# Patient Record
Sex: Female | Born: 1950 | Hispanic: No | Marital: Married
Health system: Southern US, Community
[De-identification: ages and names within clinical notes are randomized; demographics above are authoritative.]

---

## 2010-03-23 ENCOUNTER — Ambulatory Visit: Payer: Self-pay | Admitting: Family Medicine

## 2010-03-23 DIAGNOSIS — E039 Hypothyroidism, unspecified: Secondary | ICD-10-CM | POA: Insufficient documentation

## 2010-03-23 DIAGNOSIS — N390 Urinary tract infection, site not specified: Secondary | ICD-10-CM

## 2010-03-24 ENCOUNTER — Encounter: Payer: Self-pay | Admitting: Family Medicine

## 2011-01-05 NOTE — Assessment & Plan Note (Signed)
Summary: NOV UTI/ hypothyroid   Vital Signs:  Patient profile:   60 year old female Menstrual status:  hysterectomy Height:      66 inches Weight:      154 pounds BMI:     24.95 O2 Sat:      98 % on Room air Temp:     98.7 degrees F oral Pulse rate:   105 / minute BP sitting:   132 / 90  (left arm) Cuff size:   large  Vitals Entered By: Payton Spark CMA (March 23, 2010 1:22 PM)  O2 Flow:  Room air CC: New to est. ? Reaction to Macrobid over the weekend. C/O HA and muscle aches.      Menstrual Status hysterectomy   Primary Care Provider:  Seymour Bars DO  CC:  New to est. ? Reaction to Macrobid over the weekend. C/O HA and muscle aches. .  History of Present Illness: 60 yo WF presents for NOV.  She has a hx of goiter and thyroid nodules.  She was on Synthroid then on natural dessicated thyroid 30 g 2 x a day.  Her last TSH was a year ago.  Her energy level has been good.  She has not needed a biopsy, surgery or radiation.  She has had a few thyroid u/s's done that showed multnodular goiter.    She reports 10 days of dysuria followed by flank pain and joint aches.  She started Macrobid last Friday.  She is running a fever on and off.  She feels worse on the medicine.  She has heavy breathing.      Current Medications (verified): 1)  Compounded Thyroid 60 Grains 2)  Vivelle-Dot 0.05 Mg/24hr Pttw (Estradiol) .... Place Patch 2 Times Weekly.  Allergies (verified): 1)  ! Macrobid 2)  ! Ceclor  Past History:  Past Medical History: multinodular goiter G2P2  gyn [facility] hx of abnormal pap  Past Surgical History: hysterectomy for noncancerous reasons with oophorectomy. Cervix was left intact  Family History: mother died from complications after a TKR, age 6.  DM. 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 Married to [place]. 2 grown children.- Daughter in [place].  Son grown in [place], homeless - had a brain injury. Smoker.  1/2 ppd x 30 yrs. Walks 30 min 5 x a wk. Denies ETOH.  Review of Systems       + fevers/sweats/weakness, unexplained wt loss/gain, no change in vision, no difficulty hearing, ringing in ears, no hay fever/allergies, + CP/discomfort, no palpitations, no breast lump/nipple discharge, + cough/wheeze, no blood in stool, no N/V/D, no nocturia, no leaking urine, no unusual vag bleeding, no vaginal/penile discharge, no muscle/joint pain, no rash, no new/changing mole, + HA, no memory loss, no anxiety, no sleep problem, no depression, no unexplained lumps, no easy bruising/bleeding, no concern with sexual function    Physical Exam  General:  alert, well-developed, well-nourished, and well-hydrated.   Head:  normocephalic and atraumatic.   Eyes:  sclera non icteric Nose:  no nasal discharge.   Mouth:  pharynx pink and moist and fair dentition.   Neck:  no masses.   Lungs:  Normal respiratory effort, chest expands symmetrically. Lungs are clear to auscultation, no crackles or wheezes. Heart:  Normal rate and regular rhythm. S1 and S2 normal without gallop, murmur, click, rub or other extra sounds. Abdomen:  soft, non-tender, normal bowel  sounds, no distention, no masses, no guarding, no hepatomegaly, and no splenomegaly.  no CVAT Extremities:  no LE edema Skin:  color normal and no rashes.   Cervical Nodes:  No lymphadenopathy noted Psych:  good eye contact, not anxious appearing, and not depressed appearing.     Impression & Recommendations:  Problem # 1:  UTI (ICD-599.0) UA inconclusive (but also partially treated with Macrobid).  Not tolerating Macrobid, so D/C'd.  Change to Bactrim DS and f/u urine cx.   Her updated medication list for this problem includes:    Bactrim Ds 800-160 Mg Tabs (Sulfamethoxazole-trimethoprim) .Marland Kitchen... 1 tab by mouth two times a day x 3 days  Orders: T-Culture, Urine ([id number]) T-CBC w/Diff  ([id number])  Problem # 2:  UNSPECIFIED HYPOTHYROIDISM (ICD-244.9) Update thyroid labs.  Plan to fill new RX at [facility].   Orders: T-T4, Free [phone]) T-TSH [phone]) T-T3, Free [phone])  Complete Medication List: 1)  Thyroid Grain 1/2 Grain  .Marland Kitchen.. 1 tab by mouth two times a day 2)  Vivelle-dot 0.05 Mg/24hr Pttw (Estradiol) .... Place patch 2 times weekly. 3)  Bactrim Ds 800-160 Mg Tabs (Sulfamethoxazole-trimethoprim) .Marland Kitchen.. 1 tab by mouth two times a day x 3 days  Other Orders: T-Comprehensive Metabolic Panel ([phone]) T-Lipid Profile ([id number])  Patient Instructions: 1)  Change Macrobid to Bactrim DS x 3 days for UTI. 2)  Will call you w/ urine cx results on Wed. 3)  Have fasting labs drawn. 4)  Will call you w/ results and plan to change thyroid meds accordingly.   5)  Return for CPE w/o pap smear in 4 mos. Prescriptions: BACTRIM DS 800-160 MG TABS (SULFAMETHOXAZOLE-TRIMETHOPRIM) 1 tab by mouth two times a day x 3 days  #6 tabs x 0   Entered and Authorized by:   Seymour Bars DO   Signed by:   Seymour Bars DO on 03/23/2010   Method used:   Electronically to        [facility]* (retail)       [address] [place], [place]  [address]       Ph: [phone]       Fax: [phone]   RxID:   [id number]   Appended Document: NOV UTI/ hypothyroid     Allergies: 1)  ! Macrobid 2)  ! Ceclor   Complete Medication List: 1)  Thyroid Grain 1/2 Grain  .Marland Kitchen.. 1 tab by mouth two times a day 2)  Vivelle-dot 0.05 Mg/24hr Pttw (Estradiol) .... Place patch 2 times weekly. 3)  Bactrim Ds 800-160 Mg Tabs (Sulfamethoxazole-trimethoprim) .Marland Kitchen.. 1 tab by mouth two times a day x 3 days  Other Orders: UA Dipstick w/o Micro (automated)  ([id number])   Laboratory Results   Urine Tests    Routine Urinalysis   Color: yellow Appearance: Hazy Glucose: negative   (Normal Range: Negative) Bilirubin: small   (Normal Range: Negative) Ketone: trace (5)    (Normal Range: Negative) Spec. Gravity: 1.020   (Normal Range: 1.003-1.035) Blood: negative   (Normal Range: Negative) pH: 6.0   (Normal Range: 5.0-8.0) Protein: 30   (Normal Range: Negative) Urobilinogen: 0.2   (Normal Range: 0-1) Nitrite: negative   (Normal Range: Negative) Leukocyte Esterace: negative   (Normal Range: Negative)

## 2014-10-01 ENCOUNTER — Other Ambulatory Visit: Payer: Self-pay | Admitting: Obstetrics & Gynecology

## 2014-10-01 DIAGNOSIS — N63 Unspecified lump in unspecified breast: Secondary | ICD-10-CM

## 2014-10-11 ENCOUNTER — Other Ambulatory Visit: Payer: Self-pay | Admitting: Obstetrics & Gynecology

## 2014-10-11 ENCOUNTER — Ambulatory Visit
Admission: RE | Admit: 2014-10-11 | Discharge: 2014-10-11 | Disposition: A | Discharge status: Home / Self Care | Payer: Commercial Indemnity | Source: Ambulatory Visit | Attending: Obstetrics & Gynecology | Admitting: Obstetrics & Gynecology

## 2014-10-11 ENCOUNTER — Encounter (INDEPENDENT_AMBULATORY_CARE_PROVIDER_SITE_OTHER): Payer: Self-pay

## 2014-10-11 DIAGNOSIS — N63 Unspecified lump in unspecified breast: Secondary | ICD-10-CM

## 2014-10-11 DIAGNOSIS — N6320 Unspecified lump in the left breast, unspecified quadrant: Secondary | ICD-10-CM

## 2014-10-14 ENCOUNTER — Ambulatory Visit
Admission: RE | Admit: 2014-10-14 | Discharge: 2014-10-14 | Disposition: A | Discharge status: Home / Self Care | Payer: Commercial Indemnity | Source: Ambulatory Visit | Attending: Obstetrics & Gynecology | Admitting: Obstetrics & Gynecology

## 2014-10-14 ENCOUNTER — Other Ambulatory Visit: Payer: Self-pay | Admitting: Obstetrics & Gynecology

## 2014-10-14 DIAGNOSIS — N63 Unspecified lump in unspecified breast: Secondary | ICD-10-CM

## 2014-10-14 DIAGNOSIS — C50912 Malignant neoplasm of unspecified site of left female breast: Secondary | ICD-10-CM

## 2014-10-17 ENCOUNTER — Ambulatory Visit
Admission: RE | Admit: 2014-10-17 | Discharge: 2014-10-17 | Disposition: A | Discharge status: Home / Self Care | Payer: Commercial Indemnity | Source: Ambulatory Visit | Attending: Obstetrics & Gynecology | Admitting: Obstetrics & Gynecology

## 2014-10-17 DIAGNOSIS — C50912 Malignant neoplasm of unspecified site of left female breast: Secondary | ICD-10-CM

## 2014-10-17 MED ORDER — GADOBENATE DIMEGLUMINE 529 MG/ML IV SOLN
16.0000 mL | Freq: Once | INTRAVENOUS | Status: AC | PRN
  Administered 2014-10-17: 16 mL via INTRAVENOUS

## 2015-08-15 IMAGING — MG MM DIGITAL DIAGNOSTIC BILAT CAD
5 series · 5 of 5 positions shown · non-contrast
Comparison: None.

CLINICAL DATA: Palpable lump left breast

EXAM:
DIGITAL DIAGNOSTIC  BILATERAL MAMMOGRAM WITH CAD
ULTRASOUND LEFT BREAST

[R CC]
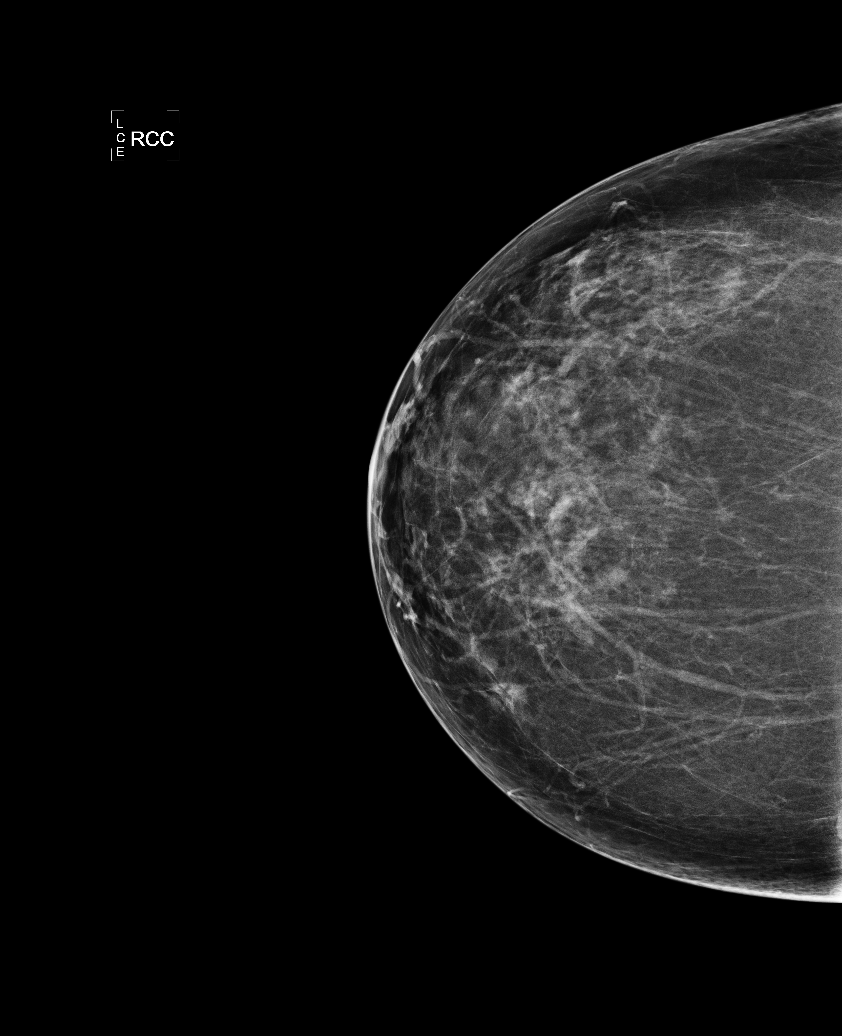

[L CC]
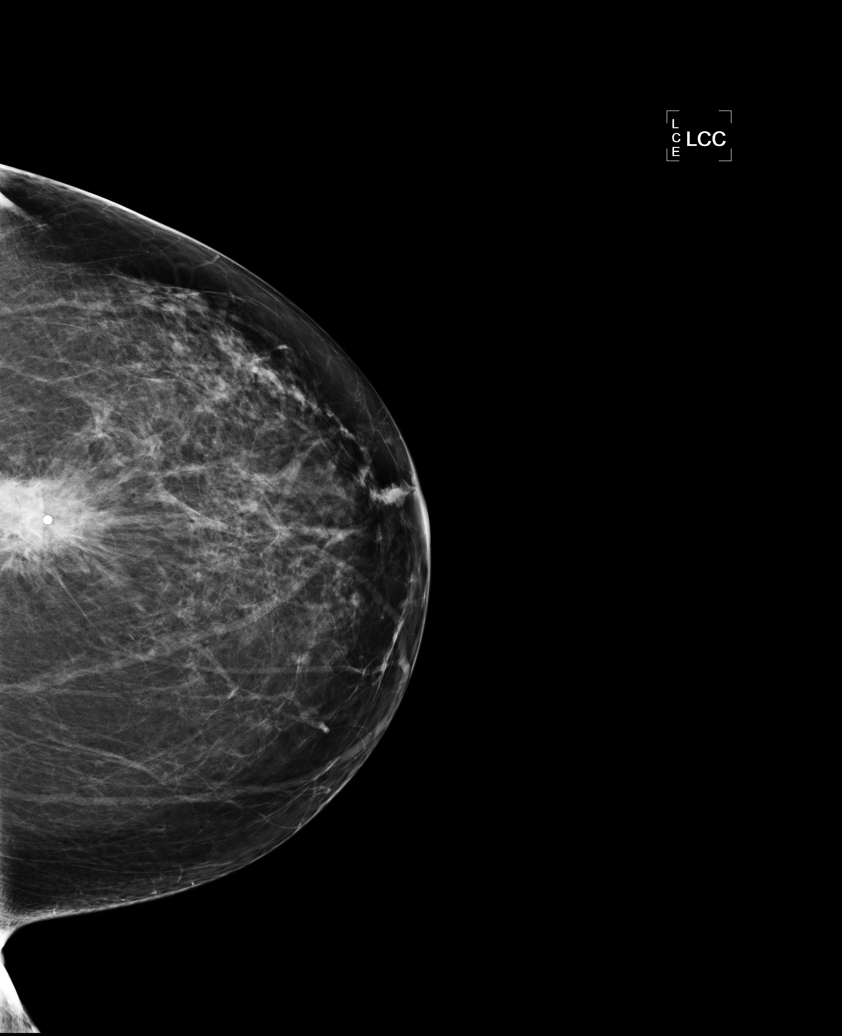

[L MLO]
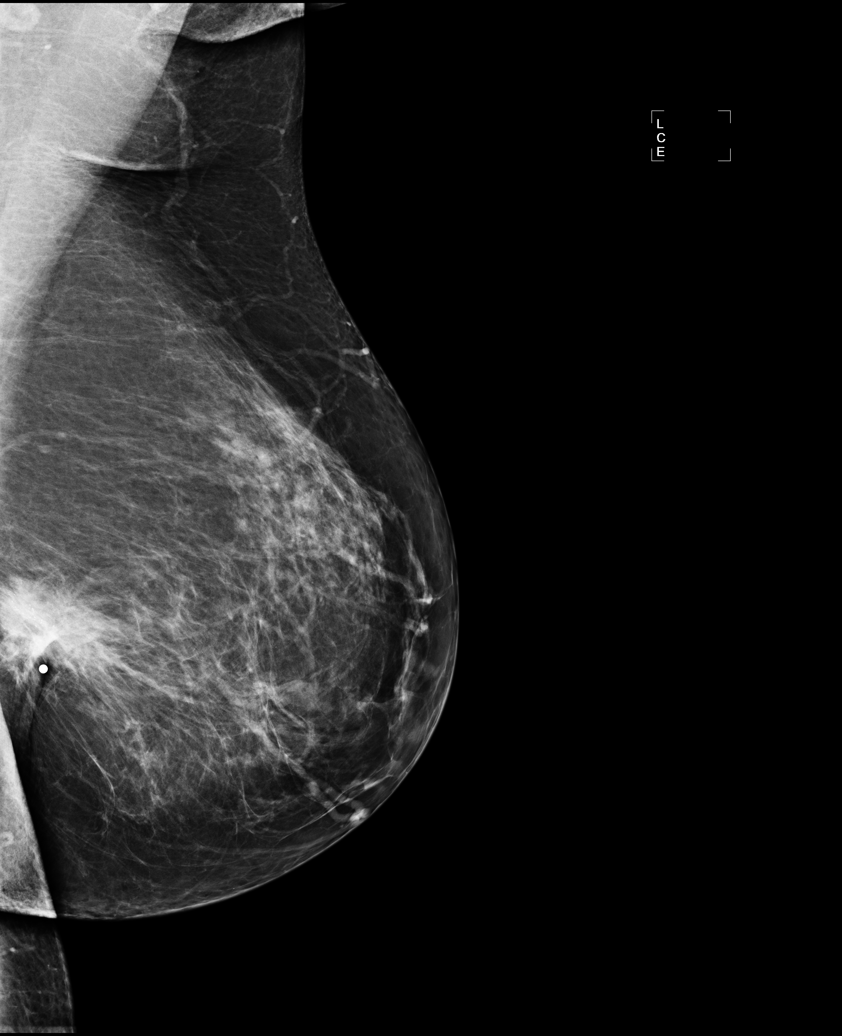

[R MLO]
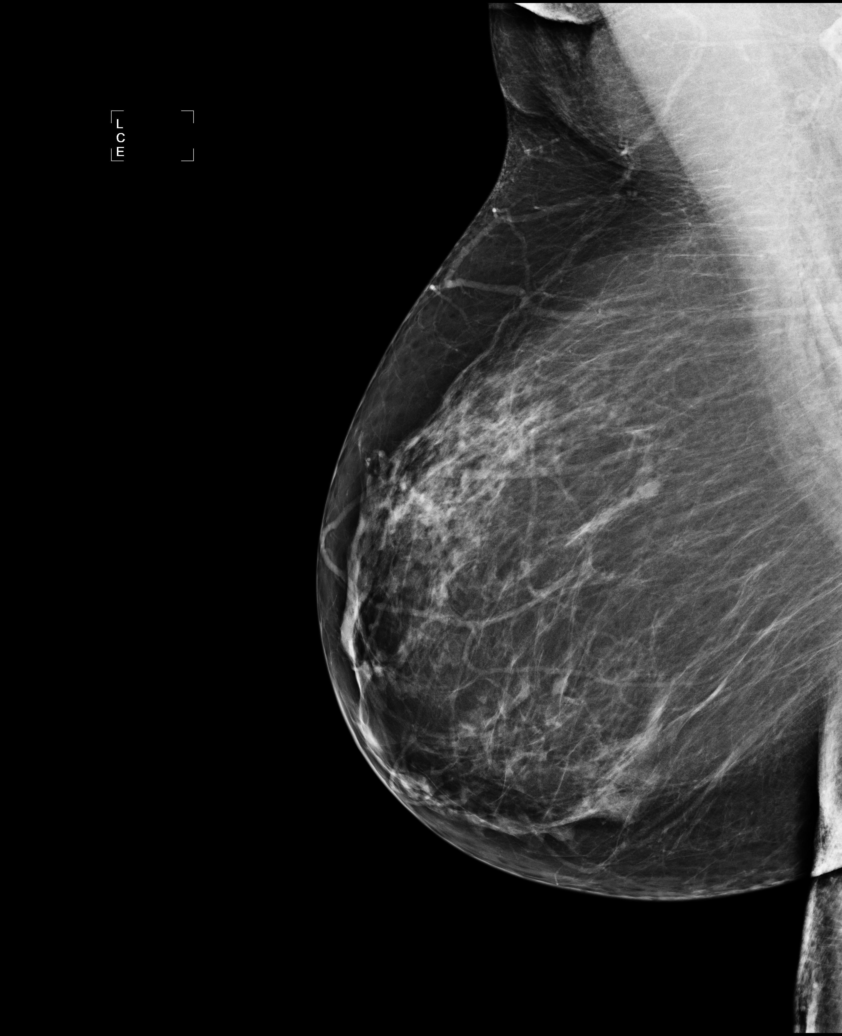

[L LM]
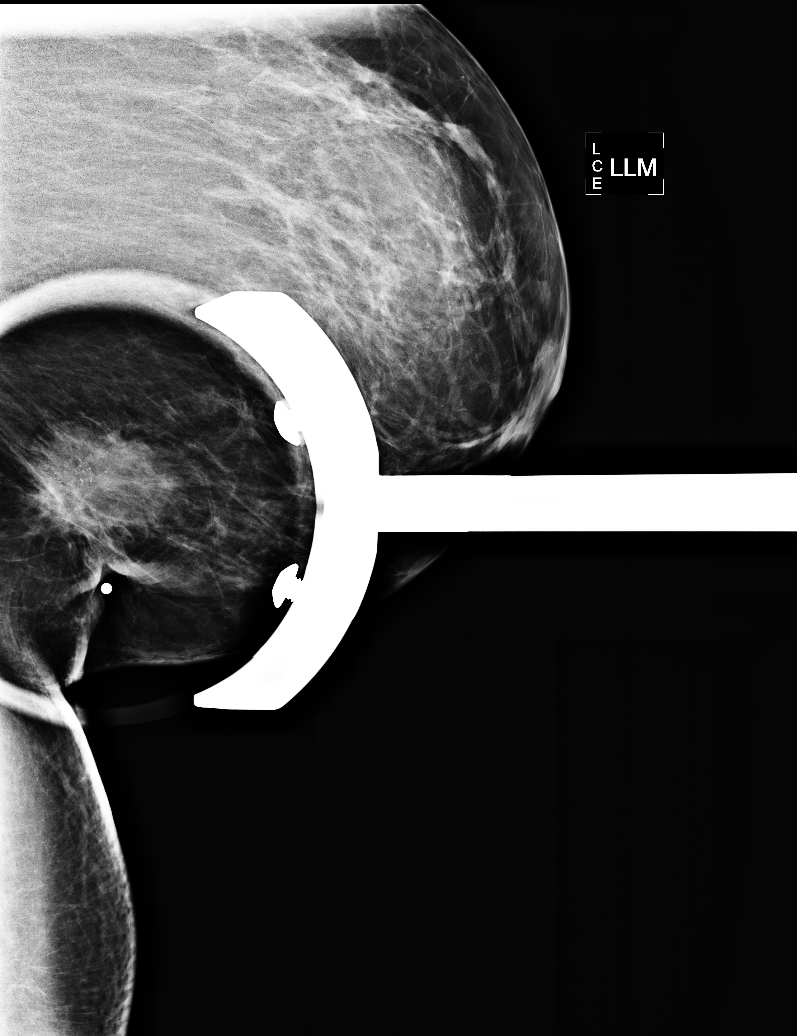

[5 of 5 positions shown; findings below may reference images not displayed]

ACR Breast Density Category b: There are scattered areas of
fibroglandular density.
FINDINGS: Cc and MLO views of bilateral breasts, spot tangential view of left
breast are submitted. There is a 3.4 x 2.8 cm spiculated mass with
associated suspicious microcalcifications in the left breast 6
o'clock palpable area. The right breasts is negative.

Mammographic images were processed with CAD.

On physical exam, there is skin retraction and dimpling at the
palpable area right breast 6 o'clock at inframammary fold.

Ultrasound is performed, showing spiculated hypoechoic mass at the
left breast 5 o'clock 8 cm from nipple measuring at least 2.1 x
x 1.4 cm with extension to the skin. This correlates to the
mammographic finding. Ultrasound of the left axilla is negative.
IMPRESSION: Highly suspicious findings.

RECOMMENDATION:
Ultrasound-guided core biopsy left breast mass.

I have discussed the findings and recommendations with the patient.
Results were also provided in writing at the conclusion of the
visit. If applicable, a reminder letter will be sent to the patient
regarding the next appointment.

BI-RADS CATEGORY  5: Highly suggestive of malignancy.

## 2017-04-05 DEATH — deceased
